# Patient Record
Sex: Male | Born: 1980 | Race: White | Hispanic: No | Marital: Single | Smoking: Current every day smoker
Health system: Southern US, Community
[De-identification: ages and names within clinical notes are randomized; demographics above are authoritative.]

---

## 2018-12-17 ENCOUNTER — Other Ambulatory Visit: Payer: Self-pay

## 2018-12-17 ENCOUNTER — Emergency Department
Admission: EM | Admit: 2018-12-17 | Discharge: 2018-12-17 | Disposition: A | Discharge status: Home / Self Care | Payer: Self-pay | Attending: Emergency Medicine | Admitting: Emergency Medicine

## 2018-12-17 DIAGNOSIS — L0291 Cutaneous abscess, unspecified: Secondary | ICD-10-CM

## 2018-12-17 DIAGNOSIS — L0201 Cutaneous abscess of face: Secondary | ICD-10-CM | POA: Insufficient documentation

## 2018-12-17 MED ORDER — MUPIROCIN 2 % EX OINT: TOPICAL_OINTMENT | CUTANEOUS | 0 refills | Status: AC

## 2018-12-17 MED ORDER — LIDOCAINE-EPINEPHRINE 1 %-1:100000 IJ SOLN
10.0000 mL | Freq: Once | INTRAMUSCULAR | Status: AC
  Administered 2018-12-17: 10 mL via INTRADERMAL
  Filled 2018-12-17: qty 10

## 2018-12-17 NOTE — ED Notes (Signed)
Abscess to chin, pt states clear liquid has seeped from abscess. Painful to eat or touch.

## 2018-12-17 NOTE — ED Triage Notes (Signed)
Pt reports walked through a spider web yesterday. Last night woke up and noticed a red area on his chin that has gotten worse.

## 2018-12-17 NOTE — ED Provider Notes (Signed)
[facility] Emergency Department Provider Note       Time seen: ----------------------------------------- 9:35 PM on 12/17/2018 -----------------------------------------   I have reviewed the triage vital signs and the nursing notes.  HISTORY   Chief Complaint Abscess    HPI Bryan Coffey is a 38 y.o. male with no significant past medical history who presents to the ED for small spider bite.  Patient reports he walked through a spiderweb yesterday and is concerned he may have been bitten by spider.  He has had an abscess before.  He noticed a red area on his chin that is gotten worse.  No past medical history on file.  There are no active problems to display for this patient.  Allergies Patient has no known allergies.  Social History Social History   Tobacco Use  . Smoking status: Not on file  Substance Use Topics  . Alcohol use: Not on file  . Drug use: Not on file   Review of Systems Constitutional: Negative for fever. Cardiovascular: Negative for chest pain. Respiratory: Negative for shortness of breath. Skin: Positive for red raised area on his chin Neurological: Negative for headaches, focal weakness or numbness.  All systems negative/normal/unremarkable except as stated in the HPI  ____________________________________________   PHYSICAL EXAM:  VITAL SIGNS: ED Triage Vitals  Enc Vitals Group     BP 12/17/18 2125 (!) 141/75     Pulse Rate 12/17/18 2125 93     Resp 12/17/18 2125 18     Temp 12/17/18 2125 98.4 F (36.9 C)     Temp Source 12/17/18 2125 Oral     SpO2 12/17/18 2125 97 %     Weight 12/17/18 2127 164 lb (74.4 kg)     Height 12/17/18 2127 6\' 2"  (1.88 m)     Head Circumference --      Peak Flow --      Pain Score 12/17/18 2126 7     Pain Loc --      Pain Edu? --      Excl. in GC? --    Constitutional: Alert and oriented. Well appearing and in no distress. ENT      Head: Normocephalic and atraumatic.  Nose: No congestion/rhinnorhea.      Mouth/Throat: Mucous membranes are moist.  Externally inferior to the lower lip on the left side there is an abscess developing that is approximately 2 x 2 cm with induration and erythema      Neck: No stridor. Neurologic:  Normal speech and language. No gross focal neurologic deficits are appreciated.  Skin: Left-sided facial abscess as dictated above ___________________________________________  ED COURSE:  As part of my medical decision making, I reviewed the following data within the electronic MEDICAL RECORD NUMBER History obtained from family if available, nursing notes, old chart and ekg, as well as notes from prior ED visits. Patient presented for skin abscess, we will assess with labs and imaging as indicated at this time.   Marland Kitchen.Incision and Drainage Date/Time: 12/17/2018 9:39 PM Performed by: Emily Filbert, MD Authorized by: Emily Filbert, MD   Consent:    Consent obtained:  Verbal   Consent given by:  Patient Location:    Type:  Abscess   Location:  Head   Head location:  Face Pre-procedure details:    Skin preparation:  Betadine Anesthesia (see MAR for exact dosages):    Anesthesia method:  Local infiltration   Local anesthetic:  Lidocaine 1% WITH epi Procedure type:  Complexity:  Simple Procedure details:    Incision types:  Single straight   Scalpel blade:  11   Drainage:  Purulent   Drainage amount:  Moderate   Wound treatment:  Drain placed   Packing materials:  1/4 in gauze Post-procedure details:    Patient tolerance of procedure:  Tolerated well, no immediate complications    ____________________________________________   DIFFERENTIAL DIAGNOSIS   Abscess, cellulitis, insect bite  FINAL ASSESSMENT AND PLAN  Abscess   Plan: The patient had presented for early abscess on his chin.  This was incised as dictated above.  We will advise topical antibiotic ointment and follow-up as needed.   Bryan Coffey  Bryan Giroux, MD    Note: This note was generated in part or whole with voice recognition software. Voice recognition is usually quite accurate but there are transcription errors that can and very often do occur. I apologize for any typographical errors that were not detected and corrected.     Emily FilbertWilliams, Bryan Kelson E, MD 12/17/18 2140

## 2018-12-21 ENCOUNTER — Encounter: Payer: Self-pay | Admitting: *Deleted

## 2018-12-21 ENCOUNTER — Other Ambulatory Visit: Payer: Self-pay

## 2018-12-21 ENCOUNTER — Emergency Department: Payer: Self-pay

## 2018-12-21 ENCOUNTER — Emergency Department
Admission: EM | Admit: 2018-12-21 | Discharge: 2018-12-22 | Disposition: A | Discharge status: Home / Self Care | Payer: Self-pay | Attending: Emergency Medicine | Admitting: Emergency Medicine

## 2018-12-21 DIAGNOSIS — L03211 Cellulitis of face: Secondary | ICD-10-CM | POA: Insufficient documentation

## 2018-12-21 DIAGNOSIS — Z79899 Other long term (current) drug therapy: Secondary | ICD-10-CM | POA: Insufficient documentation

## 2018-12-21 DIAGNOSIS — F17210 Nicotine dependence, cigarettes, uncomplicated: Secondary | ICD-10-CM | POA: Insufficient documentation

## 2018-12-21 LAB — COMPREHENSIVE METABOLIC PANEL
ALT: 73 U/L — ABNORMAL HIGH (ref 0–44)
AST: 55 U/L — ABNORMAL HIGH (ref 15–41)
Albumin: 3.8 g/dL (ref 3.5–5.0)
Alkaline Phosphatase: 94 U/L (ref 38–126)
Anion gap: 8 (ref 5–15)
BUN: 11 mg/dL (ref 6–20)
CO2: 27 mmol/L (ref 22–32)
Calcium: 8.6 mg/dL — ABNORMAL LOW (ref 8.9–10.3)
Chloride: 102 mmol/L (ref 98–111)
Creatinine, Ser: 0.73 mg/dL (ref 0.61–1.24)
GFR calc Af Amer: >60 mL/min (ref >60)
GFR calc non Af Amer: >60 mL/min (ref >60)
Glucose, Bld: 109 mg/dL — ABNORMAL HIGH (ref 70–99)
Potassium: 4.7 mmol/L (ref 3.5–5.1)
Sodium: 137 mmol/L (ref 135–145)
Total Bilirubin: 0.7 mg/dL (ref 0.3–1.2)
Total Protein: 7.6 g/dL (ref 6.5–8.1)

## 2018-12-21 LAB — CBC WITH DIFFERENTIAL/PLATELET
Abs Immature Granulocytes: 0.02 10*3/uL (ref 0.00–0.07)
Basophils Absolute: 0 10*3/uL (ref 0.0–0.1)
Basophils Relative: 0 %
Eosinophils Absolute: 0.1 10*3/uL (ref 0.0–0.5)
Eosinophils Relative: 1 %
HCT: 36.7 % — ABNORMAL LOW (ref 39.0–52.0)
Hemoglobin: 11.8 g/dL — ABNORMAL LOW (ref 13.0–17.0)
Immature Granulocytes: 0 %
Lymphocytes Relative: 25 %
Lymphs Abs: 2.3 10*3/uL (ref 0.7–4.0)
MCH: 27.1 pg (ref 26.0–34.0)
MCHC: 32.2 g/dL (ref 30.0–36.0)
MCV: 84.4 fL (ref 80.0–100.0)
Monocytes Absolute: 0.8 10*3/uL (ref 0.1–1.0)
Monocytes Relative: 8 %
Neutro Abs: 6.3 10*3/uL (ref 1.7–7.7)
Neutrophils Relative %: 66 %
Platelets: 333 10*3/uL (ref 150–400)
RBC: 4.35 MIL/uL (ref 4.22–5.81)
RDW: 14 % (ref 11.5–15.5)
WBC: 9.5 10*3/uL (ref 4.0–10.5)
nRBC: 0 % (ref 0.0–0.2)

## 2018-12-21 LAB — LACTIC ACID, PLASMA: Lactic Acid, Venous: 1.3 mmol/L (ref 0.5–1.9)

## 2018-12-21 MED ORDER — CLINDAMYCIN PHOSPHATE 600 MG/50ML IV SOLN
600.0000 mg | Freq: Once | INTRAVENOUS | Status: AC
  Administered 2018-12-21: 600 mg via INTRAVENOUS
  Filled 2018-12-21: qty 50

## 2018-12-21 MED ORDER — SODIUM CHLORIDE 0.9 % IV BOLUS
1000.0000 mL | Freq: Once | INTRAVENOUS | Status: AC
  Administered 2018-12-21: 23:00:00 1000 mL via INTRAVENOUS

## 2018-12-21 MED ORDER — MORPHINE SULFATE (PF) 4 MG/ML IV SOLN
4.0000 mg | Freq: Once | INTRAVENOUS | Status: AC
  Administered 2018-12-21: 4 mg via INTRAVENOUS
  Filled 2018-12-21: qty 1

## 2018-12-21 MED ORDER — ONDANSETRON HCL 4 MG/2ML IJ SOLN
4.0000 mg | Freq: Once | INTRAMUSCULAR | Status: AC
  Administered 2018-12-21: 4 mg via INTRAVENOUS
  Filled 2018-12-21: qty 2

## 2018-12-21 MED ORDER — IOHEXOL 300 MG/ML  SOLN
75.0000 mL | Freq: Once | INTRAMUSCULAR | Status: AC | PRN
  Administered 2018-12-21: 23:00:00 75 mL via INTRAVENOUS
  Filled 2018-12-21: qty 75

## 2018-12-21 NOTE — ED Provider Notes (Signed)
[facility] Emergency Department Provider Note  ____________________________________________  Time seen: Approximately 10:42 PM  I have reviewed the triage vital signs and the nursing notes.   HISTORY  Chief Complaint Abscess    HPI Bryan Coffey is a 38 y.o. male who presents emergency department complaining of increased erythema, edema, pain to the external left lower jaw.  Patient was seen in this department several days prior, had abscess formation.  This area was incised and drained and patient was placed on a topical antibiotic.  Patient reports that the area has significantly worsened, area is still open and draining but has increased erythema, edema extending under his jaw with increased pain.  No fevers or chills.  No difficulty breathing or swallowing.  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  No other complaints at this time.  No other skin lesions.         History reviewed. No pertinent past medical history.  There are no active problems to display for this patient.   History reviewed. No pertinent surgical history.  Prior to Admission medications   Medication Sig Start Date End Date Taking? Authorizing Provider  clindamycin (CLEOCIN) 300 MG capsule Take 1 capsule (300 mg total) by mouth 4 (four) times daily. 12/22/18   Kennedie Pardoe, Delorise Royals, PA-C  HYDROcodone-acetaminophen (NORCO/VICODIN) 5-325 MG tablet Take 1 tablet by mouth every 4 (four) hours as needed for moderate pain. 12/22/18   Boone Gear, Delorise Royals, PA-C  mupirocin ointment (BACTROBAN) 2 % Apply to affected area 3 times daily 12/17/18   Emily Filbert, MD    Allergies Patient has no known allergies.  No family history on file.  Social History Social History   Tobacco Use  . Smoking status: Current Every Day Smoker  . Smokeless tobacco: Never Used  Substance Use Topics  . Alcohol use: Not Currently  . Drug use: Not Currently     Review of  Systems  Constitutional: No fever/chills Eyes: No visual changes.  Cardiovascular: no chest pain. Respiratory: no cough. No SOB. Gastrointestinal: No abdominal pain.  No nausea, no vomiting.  Musculoskeletal: Negative for musculoskeletal pain. Skin: Positive for an erythematous edematous lesion to the external left lower jaw. Neurological: Negative for headaches, focal weakness or numbness. 10-point ROS otherwise negative.  ____________________________________________   PHYSICAL EXAM:  VITAL SIGNS: ED Triage Vitals  Enc Vitals Group     BP 12/21/18 2027 124/79     Pulse Rate 12/21/18 2027 90     Resp 12/21/18 2027 18     Temp 12/21/18 2027 98.2 F (36.8 C)     Temp Source 12/21/18 2027 Oral     SpO2 12/21/18 2027 98 %     Weight 12/21/18 2021 164 lb (74.4 kg)     Height 12/21/18 2021 6\' 2"  (1.88 m)     Head Circumference --      Peak Flow --      Pain Score 12/21/18 2021 8     Pain Loc --      Pain Edu? --      Excl. in GC? --      Constitutional: Alert and oriented. Well appearing and in no acute distress. Eyes: Conjunctivae are normal. PERRL. EOMI. Head: Atraumatic.  Patient has a large erythematous edematous lesion noted to the external left lower mandibular region.  This measures approximately 6 cm in diameter.  Area is draining purulent material.  Additional material is expressed with palpation.  No significant  fluctuance.  Area is very tender to palpation.  No significant regional lymphadenopathy.  No intraoral extension. ENT:      Ears:       Nose: No congestion/rhinnorhea.      Mouth/Throat: Mucous membranes are moist.  Neck: No stridor.  Hematological/Lymphatic/Immunilogical: No cervical lymphadenopathy.  Cardiovascular: Normal rate, regular rhythm. Normal S1 and S2.  Good peripheral circulation. Respiratory: Normal respiratory effort without tachypnea or retractions. Lungs CTAB. Good air entry to the bases with no decreased or absent breath  sounds. Musculoskeletal: Full range of motion to all extremities. No gross deformities appreciated. Neurologic:  Normal speech and language. No gross focal neurologic deficits are appreciated.  Skin:  Skin is warm, dry and intact. No rash noted. Psychiatric: Mood and affect are normal. Speech and behavior are normal. Patient exhibits appropriate insight and judgement.   ____________________________________________   LABS (all labs ordered are listed, but only abnormal results are displayed)  Labs Reviewed  COMPREHENSIVE METABOLIC PANEL - Abnormal; Notable for the following components:      Result Value   Glucose, Bld 109 (*)    Calcium 8.6 (*)    AST 55 (*)    ALT 73 (*)    All other components within normal limits  CBC WITH DIFFERENTIAL/PLATELET - Abnormal; Notable for the following components:   Hemoglobin 11.8 (*)    HCT 36.7 (*)    All other components within normal limits  LACTIC ACID, PLASMA   ____________________________________________  EKG   ____________________________________________  RADIOLOGY I personally viewed and evaluated these images as part of my medical decision making, as well as reviewing the written report by the radiologist.  Ct Maxillofacial W Contrast  Result Date: 12/22/2018 CLINICAL DATA:  Initial evaluation for abscess to chin, erythema with drainage. EXAM: CT MAXILLOFACIAL WITH CONTRAST TECHNIQUE: Multidetector CT imaging of the maxillofacial structures was performed with intravenous contrast. Multiplanar CT image reconstructions were also generated. CONTRAST:  75mL OMNIPAQUE IOHEXOL 300 MG/ML  SOLN COMPARISON:  None. FINDINGS: Osseous: Examination technically limited by motion artifact. No acute osseous abnormality about the face. Periapical lucency noted about the left maxillary lateral incisor (series 3, image 49). Orbits: Globes and orbital soft tissues within normal limits. Sinuses: Paranasal sinuses are clear. Mastoid air cells and middle ear  cavities are well pneumatized and free of fluid. Soft tissues: Focal soft tissue swelling seen at the central and left aspect of the chin, just to the left of midline (series 2, image 68), suggesting cellulitis given provided history. Mild hazy stranding extends into the submental region. Overlying focal skin protuberance within this region could reflect a focal draining sinus tract (series 2, image 68). Probable underlying ill-defined phlegmon without definite discrete abscess or drainable fluid collection. No other collections elsewhere about the chin. Mildly prominent submental lymph nodes noted measuring up to 7 mm, likely reactive. No other acute soft tissue abnormality identified about the visualized face and neck. Oral cavity and oropharynx limited assessment due to motion artifact and extensive streak artifact from dental amalgam. No retropharyngeal collection. Limited intracranial: Unremarkable. IMPRESSION: 1. Focal soft tissue swelling at the central and left aspect of the chin, just to the left of midline, consistent with cellulitis given provided history. Overlying focal skin protuberance within this region could reflect a focal draining sinus tract. No definite discrete abscess or drainable fluid collection identified on this motion degraded exam. 2. Periapical lucency about the left maxillary lateral incisor, suggesting periapical abscess. No associated inflammatory changes. She Electronically Signed  By: Rise Muock M.D.   On: 12/22/2018 00:50    ____________________________________________    PROCEDURES  Procedure(s) performed:    Procedures    Medications  sodium chloride 0.9 % bolus 1,000 mL (1,000 mLs Intravenous New Bag/Given 12/21/18 2232)  clindamycin (CLEOCIN) IVPB 600 mg (0 mg Intravenous Stopped 12/21/18 2346)  morphine 4 MG/ML injection 4 mg (4 mg Intravenous Given 12/21/18 2347)  ondansetron (ZOFRAN) injection 4 mg (4 mg Intravenous Given 12/21/18 2347)  iohexol  (OMNIPAQUE) 300 MG/ML solution 75 mL (75 mLs Intravenous Contrast Given 12/21/18 2310)     ____________________________________________   INITIAL IMPRESSION / ASSESSMENT AND PLAN / ED COURSE  Pertinent labs & imaging results that were available during my care of the patient were reviewed by me and considered in my medical decision making (see chart for details).  Review of the [place] CSRS was performed in accordance of the [facility] prior to dispensing any controlled drugs.           Patient's diagnosis is consistent with facial cellulitis.  Patient presented to the emergency department with a complaint of worsening cellulitis to the left chin.  Patient emergency to this department, had small abscess opened and incised.  Patient was given topical antibiotics and area worsen.  Obvious cellulitis to the chin.  Patient had labs and imaging to evaluate for deeper underlying abscess which returns with reassuring results.  Patient was given IV clindamycin and will be discharged with oral clindamycin.  Limited prescription for pain medication as well.  Follow-up with primary care as needed. Patient is given ED precautions to return to the ED for any worsening or new symptoms.     ____________________________________________  FINAL CLINICAL IMPRESSION(S) / ED DIAGNOSES  Final diagnoses:  Facial cellulitis      NEW MEDICATIONS STARTED DURING THIS VISIT:  ED Discharge Orders         Ordered    clindamycin (CLEOCIN) 300 MG capsule  4 times daily     12/22/18 0057    HYDROcodone-acetaminophen (NORCO/VICODIN) 5-325 MG tablet  Every 4 hours PRN     12/22/18 0057              This chart was dictated using voice recognition software/Dragon. Despite best efforts to proofread, errors can occur which can change the meaning. Any change was purely unintentional.    Racheal Patches, PA-C 12/22/18 0102    Phineas Semen, MD 12/22/18 [phone]

## 2018-12-21 NOTE — ED Notes (Signed)
Patient states he was here a couple days ago and was given an antibotic cream but no oral antibiotic and abscess has gotten worse. Patient in NAD.

## 2018-12-21 NOTE — ED Triage Notes (Signed)
Pt has an abscess to the chin.  Pt was seen here in the ER for similar sx recently.  Area red and draining.  Pt alert.

## 2018-12-22 ENCOUNTER — Encounter: Payer: Self-pay | Admitting: Emergency Medicine

## 2018-12-22 ENCOUNTER — Emergency Department: Payer: Self-pay

## 2018-12-22 MED ORDER — HYDROCODONE-ACETAMINOPHEN 5-325 MG PO TABS: 1.0000 | ORAL_TABLET | ORAL | 0 refills | Status: AC | PRN

## 2018-12-22 MED ORDER — CLINDAMYCIN HCL 300 MG PO CAPS: 300.0000 mg | ORAL_CAPSULE | Freq: Four times a day (QID) | ORAL | 0 refills | Status: DC

## 2020-09-04 ENCOUNTER — Encounter: Payer: Self-pay | Admitting: Emergency Medicine

## 2020-09-04 ENCOUNTER — Emergency Department
Admission: EM | Admit: 2020-09-04 | Discharge: 2020-09-04 | Disposition: A | Discharge status: Home / Self Care | Payer: Self-pay | Attending: Emergency Medicine | Admitting: Emergency Medicine

## 2020-09-04 ENCOUNTER — Other Ambulatory Visit: Payer: Self-pay

## 2020-09-04 DIAGNOSIS — F17200 Nicotine dependence, unspecified, uncomplicated: Secondary | ICD-10-CM | POA: Insufficient documentation

## 2020-09-04 DIAGNOSIS — K047 Periapical abscess without sinus: Secondary | ICD-10-CM | POA: Insufficient documentation

## 2020-09-04 MED ORDER — CLINDAMYCIN HCL 150 MG PO CAPS
300.0000 mg | ORAL_CAPSULE | Freq: Once | ORAL | Status: AC
  Administered 2020-09-04: 300 mg via ORAL
  Filled 2020-09-04: qty 2

## 2020-09-04 MED ORDER — CLINDAMYCIN HCL 300 MG PO CAPS: 300.0000 mg | ORAL_CAPSULE | Freq: Three times a day (TID) | ORAL | 0 refills | Status: AC

## 2020-09-04 MED ORDER — LIDOCAINE VISCOUS HCL 2 % MT SOLN
15.0000 mL | Freq: Once | OROMUCOSAL | Status: AC
  Administered 2020-09-04: 15 mL via OROMUCOSAL
  Filled 2020-09-04: qty 15

## 2020-09-04 MED ORDER — LIDOCAINE VISCOUS HCL 2 % MT SOLN: 15.0000 mL | OROMUCOSAL | 0 refills | Status: AC | PRN

## 2020-09-04 NOTE — ED Triage Notes (Signed)
Pt to ED via POV with c/o R upper jaw dental pain that started several days ago with swelling that started yesterday. Pt states pain 7/10 with throbbing pain noted.

## 2020-09-04 NOTE — ED Notes (Signed)
Pt agreeable with d/c plan as discussed by provider- this nurse has verbally reinforced d/c instructions and provided pt with written copy - pt acknowledges verbal understanding of d/ plan and denies any additional needs, questions, concerns

## 2020-09-04 NOTE — Discharge Instructions (Signed)
OPTIONS FOR DENTAL FOLLOW UP CARE ° °[place] [facility] and [facility] - Local Safety Net Dental Clinics °http://www.ncdhhs.gov/dph/oralhealth/services/safetynetclinics.htm °  °[facility] (336-562-3123) ° °[facility] ([phone]) ° °[facility] ([phone] ext [phone]) ° °[facility] ([phone]) ° °[facility] ([phone]) °This clinic caters to the indigent population and is on a lottery system. °Location: °[facility], [address], [place] °Clinic Hours: °Wednesdays from 6pm - 9pm, patients seen by a lottery system. °For dates, call or go to [email or web address]/[facility]/[email or web address] °Services: °Cleanings, fillings and simple extractions. °Payment Options: °DENTAL WORK IS FREE OF CHARGE. Bring proof of income or support. °Best way to get seen: °Arrive at 5:15 pm - this is a lottery, NOT first come/first serve, so arriving earlier will not increase your chances of being seen. °  °  °[facility] °[phone] °Select option 1 for emergencies °  °Location: °[facility], [address], [place] °Clinic Hours: °No walk-ins accepted - call the day before to schedule an appointment. °Check in times are 9:30 am and 1:30 pm. °Services: °Simple extractions, temporary fillings, pulpectomy/pulp debridement, uncomplicated abscess drainage. °Payment Options: °PAYMENT IS DUE AT THE TIME OF SERVICE.  Fee is usually $100-200, additional surgical procedures (e.g. abscess drainage) may be extra. °Cash, checks, Visa/MasterCard accepted.  Can file Medicaid if patient is covered for dental - patient should call case worker to check. °No discount for [facility] patients. °Best way to get seen: °MUST call the day before and get onto the schedule. Can usually be seen the next 1-2 days. No walk-ins accepted. °  °  °[facility] °[phone] °   °Location: °[facility], [address], [place] °Clinic Hours: °M, W, Th, F 8am or 1:30pm, Tues 9a or 1:30 - first come/first served. °Services: °Simple extractions, temporary fillings, uncomplicated abscess drainage.  You do not need to be an [place] resident. °Payment Options: °PAYMENT IS DUE AT THE TIME OF SERVICE. °Dental insurance, otherwise sliding scale - bring proof of income or support. °Depending on income and treatment needed, cost is usually $50-200. °Best way to get seen: °Arrive early as it is first come/first served. °  °  °[facility] °[phone] °  °Location: °[address] °Clinic Hours: °Mon-Thu 8a-5p °Services: °Most basic dental services including extractions and fillings. °Payment Options: °PAYMENT IS DUE AT THE TIME OF SERVICE. °Sliding scale, up to 50% off - bring proof if income or support. °Medicaid with dental option accepted. °Best way to get seen: °Call to schedule an appointment, can usually be seen within 2 weeks OR they will try to see walk-ins - show up at 8a or 2p (you may have to wait). °  °  °[facility] °[phone] °[place] RESIDENTS ONLY °  °Location: °[facility], [address], [place], [place] [address] °Clinic Hours: By appointment only. °Monday - Thursday 8am-5pm, Friday 8am-12pm °Services: Cleanings, fillings, extractions. °Payment Options: °PAYMENT IS DUE AT THE TIME OF SERVICE. °Cash, Visa or MasterCard. Sliding scale - $30 minimum per service. °Best way to get seen: °Come in to office, complete packet and make an appointment - need proof of income °or support monies for each household member and proof of [place] residence. °Usually takes about a month to get in. °  °  °[facility] °[phone] °  °Location: °[address]   [place] °Clinic Hours: Walk-in Urgent Care Dental Services are offered Monday-Friday  mornings only. °The numbers of emergencies accepted daily is limited to the number of °providers available. °Maximum 15 - Mondays, Wednesdays & Thursdays °Maximum 10 - Tuesdays & Fridays °Services: °You do not need to be a [place] resident to be seen for a dental emergency. °Emergencies are defined as pain, swelling, abnormal bleeding, or dental trauma. Walkins will receive x-rays if needed. °NOTE: Dental cleaning is not an emergency. °Payment Options: °PAYMENT IS DUE AT THE TIME OF SERVICE. °Minimum co-pay is $40.00 for uninsured patients. °Minimum co-pay is $3.00 for Medicaid with dental coverage. °Dental Insurance is accepted and must be presented at time of visit. °Medicare does not cover dental. °Forms of payment: Cash, credit card, checks. °Best way to get seen: °If not previously registered with the clinic, walk-in dental registration begins at 7:15 am and is on a first come/first serve basis. °If previously registered with the clinic, call to make an appointment. °  °  °[facility] °[phone] °[place] RESIDENTS ONLY °  °Location: °[address], [place], [place] °Clinic Hours: °Mon-Thu 10a-2p °Services: Extractions only! °Payment Options: °FREE (donations accepted) - bring proof of income or support °Best way to get seen: °Call and schedule an appointment OR come at 8am on the 1st Monday of every month (except for holidays) when it is first come/first served. °  °  °[facility] °[phone] °  °Location: °[address], [place] °Clinic Hours: °Friday mornings °Services, Payment Options, Best way to get seen: °Call for info °

## 2020-09-04 NOTE — ED Provider Notes (Signed)
[facility] Emergency Department Provider Note  ____________________________________________   Event Date/Time   First MD Initiated Contact with Patient 09/04/20 1928     (approximate)  I have reviewed the triage vital signs and the nursing notes.   HISTORY  Chief Complaint Dental Pain   HPI Bryan Coffey is a 40 y.o. male who presents to the emergency department for evaluation of right upper jaw pain.  Patient states he has had pain and throbbing in this area that started several days ago.  He reports the pain to be a 7/10.  He has not tried any alleviating factors.  Patient states that he has had multiple bouts of dental work not back when he was in [employer], none recent.  He has made a dentist appointment however could not get in until 1/27.  He did patient earlier today and came for antibiotics.  He denies any fevers, pain under the tongue, pain in the neck.       History reviewed. No pertinent past medical history.  There are no problems to display for this patient.   History reviewed. No pertinent surgical history.  Prior to Admission medications   Medication Sig Start Date End Date Taking? Authorizing Provider  clindamycin (CLEOCIN) 300 MG capsule Take 1 capsule (300 mg total) by mouth 3 (three) times daily for 10 days. 09/04/20 09/14/20 Yes Fitz Matsuo, Ruben Gottron, PA  lidocaine (XYLOCAINE) 2 % solution Use as directed 15 mLs in the mouth or throat as needed for mouth pain. 09/04/20  Yes Lucy Chris, PA  HYDROcodone-acetaminophen (NORCO/VICODIN) 5-325 MG tablet Take 1 tablet by mouth every 4 (four) hours as needed for moderate pain. 12/22/18   Cuthriell, Delorise Royals, PA-C  mupirocin ointment (BACTROBAN) 2 % Apply to affected area 3 times daily 12/17/18   Emily Filbert, MD    Allergies Patient has no known allergies.  History reviewed. No pertinent family history.  Social History Social History   Tobacco Use  . Smoking status:  Current Every Day Smoker  . Smokeless tobacco: Never Used  Substance Use Topics  . Alcohol use: Not Currently  . Drug use: Not Currently    Review of Systems Constitutional: No fever/chills Eyes: No visual changes. ENT: + Dental pain, no sore throat. Cardiovascular: Denies chest pain. Respiratory: Denies shortness of breath. Gastrointestinal: No abdominal pain.  No nausea, no vomiting.  No diarrhea.  No constipation. Genitourinary: Negative for dysuria. Musculoskeletal: Negative for back pain. Skin: Negative for rash. Neurological: Negative for headaches, focal weakness or numbness.  ____________________________________________   PHYSICAL EXAM:  VITAL SIGNS: ED Triage Vitals [09/04/20 1834]  Enc Vitals Group     BP 111/70     Pulse Rate 93     Resp 20     Temp 98.5 F (36.9 C)     Temp Source Oral     SpO2 98 %     Weight 194 lb (88 kg)     Height 6\' 2"  (1.88 m)     Head Circumference      Peak Flow      Pain Score 7     Pain Loc      Pain Edu?      Excl. in GC?    Constitutional: Alert and oriented. Well appearing and in no acute distress. Eyes: Conjunctivae are normal. PERRL. EOMI. Head: Atraumatic. Nose: No congestion/rhinnorhea. Mouth/Throat: There is a moderate amount of right-sided facial swelling at the location of the upper jaw.  No facial erythema.  No tenderness to palpation of the mandible.  There are multiple dental caries and broken teeth within the mouth.  No specific abscess able to be identified.  There is no tenderness under the tongue Neck: No stridor.  There is no tenderness to the central anterior portion of the neck, mild cervical lymphadenopathy, no swelling or erythema of the neck Cardiovascular: Normal rate, regular rhythm. Grossly normal heart sounds.  Good peripheral circulation. Respiratory: Normal respiratory effort.  No retractions. Lungs CTAB. Skin:  Skin is warm, dry and intact. No rash noted. Psychiatric: Mood and affect are normal.  Speech and behavior are normal.  _____________________________________________   INITIAL IMPRESSION / ASSESSMENT AND PLAN / ED COURSE  As part of my medical decision making, I reviewed the following data within the electronic MEDICAL RECORD NUMBER Nursing notes reviewed and incorporated        Patient is a 40 year old male who presents to the emergency department for evaluation of right upper sided jaw pain has been present for 3 days that began having some facial swelling associated today.  He denies fever, denies pain over the tongue, denies anterior neck pain.  See HPI for further details.  In triage, the patient has normal vital signs.  On physical exam, there is a moderate amount of facial swelling, most prominent in the maxillary region on the right-hand side.  There is no swelling over the mandible or tenderness there.  There is no tenderness of the anterior neck, no swelling or erythema of the anterior neck.  Mild cervical lymphadenopathy.  Differentials considered for the patient include dental carry, dental abscess, deep abscess, Ludwig's, Lemierre's, other infectious pathology.  The patient has been afebrile during the course of his pain.  The patient specifically declines labs or imaging or IV antibiotics even despite the facial swelling.  We will place the patient on clindamycin.  Strict return precautions were given for fever, worsening pain despite antibiotics, difficulty breathing, anterior neck pain, pain under the tongue.  Patient acknowledges these.  He also specifically requests no pill form of pain management including no ibuprofen given that he had former problems with opioids.  He was offered viscous lidocaine prescription which he accepted.  Patient will return with any acute worsening.      ____________________________________________   FINAL CLINICAL IMPRESSION(S) / ED DIAGNOSES  Final diagnoses:  Dental abscess     ED Discharge Orders         Ordered    clindamycin  (CLEOCIN) 300 MG capsule  3 times daily        09/04/20 1944    lidocaine (XYLOCAINE) 2 % solution  As needed        09/04/20 1944          *Please note:  Deondre Marinaro was evaluated in Emergency Department on 09/04/2020 for the symptoms described in the history of present illness. He was evaluated in the context of the global COVID-19 pandemic, which necessitated consideration that the patient might be at risk for infection with the SARS-CoV-2 virus that causes COVID-19. Institutional protocols and algorithms that pertain to the evaluation of patients at risk for COVID-19 are in a state of rapid change based on information released by regulatory bodies including the CDC and federal and state organizations. These policies and algorithms were followed during the patient's care in the ED.  Some ED evaluations and interventions may be delayed as a result of limited staffing during and the pandemic.*   Note:  This document was prepared using Dragon voice recognition software and may include unintentional dictation errors.   Lucy Chris, PA 09/04/20 2052    Delton [place], MD 09/04/20 [phone]

## 2020-10-21 IMAGING — CT CT MAXILLOFACIAL WITH CONTRAST
3 series · 15 of 47 positions shown, 18 images · IV contrast (omnipaque)
Comparison: None.

CLINICAL DATA: Initial evaluation for abscess to chin, erythema
with drainage.

EXAM:
CT MAXILLOFACIAL WITH CONTRAST
TECHNIQUE: Multidetector CT imaging of the maxillofacial structures was
performed with intravenous contrast. Multiplanar CT image
reconstructions were also generated.
CONTRAST:  75mL OMNIPAQUE IOHEXOL 300 MG/ML  SOLN

[Series 2: max soft · axial · 0.37mm/px · z∈[-222,-68]mm · 9 of 91 slices shown, 12 images]
[im 7/91  brain]
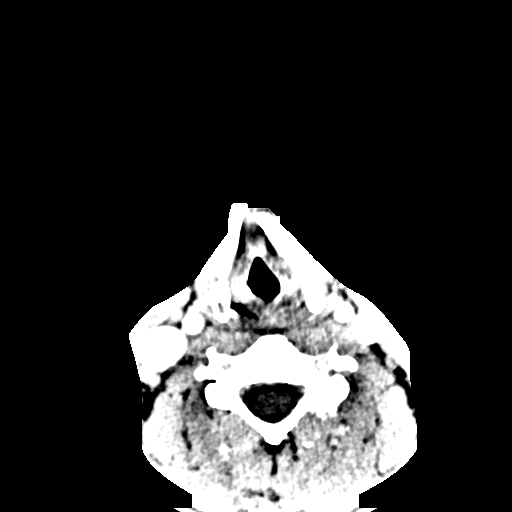
[im 7/91  bone]
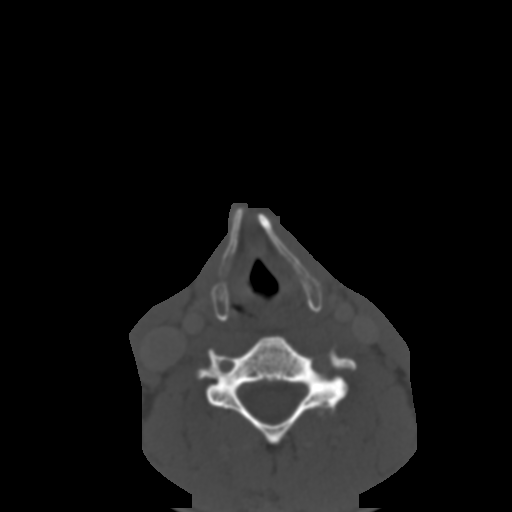
[im 16/91  bone]
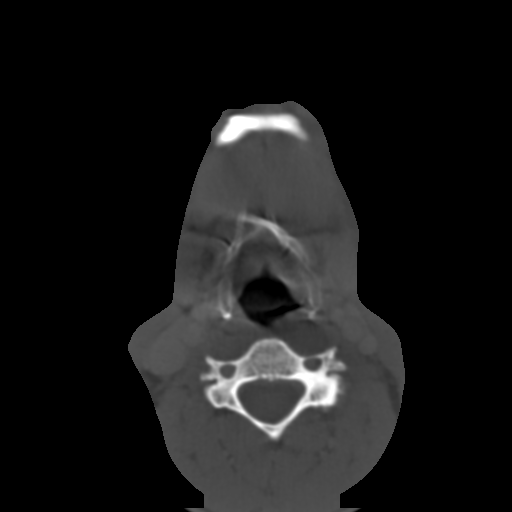
[im 25/91  bone]
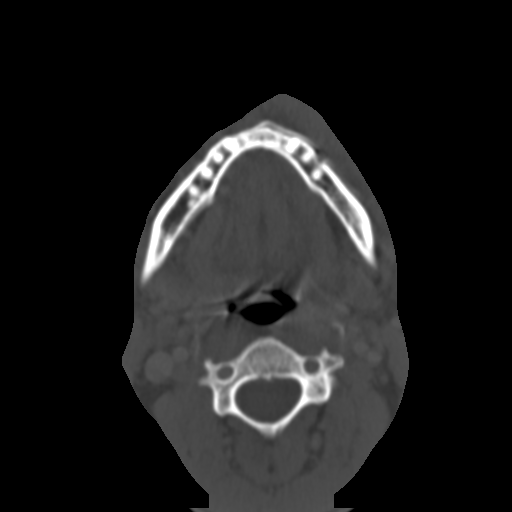
[im 35/91  bone]
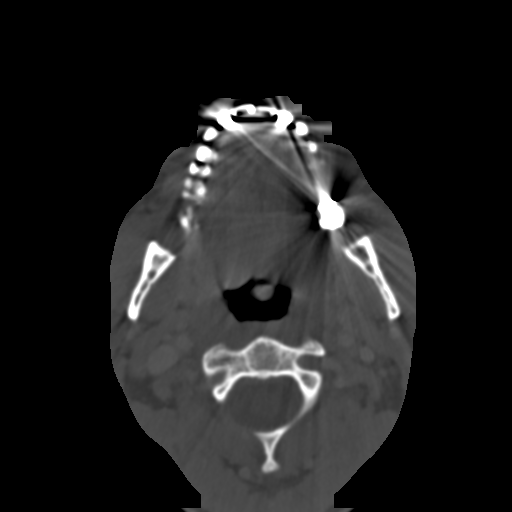
[im 47/91  brain]
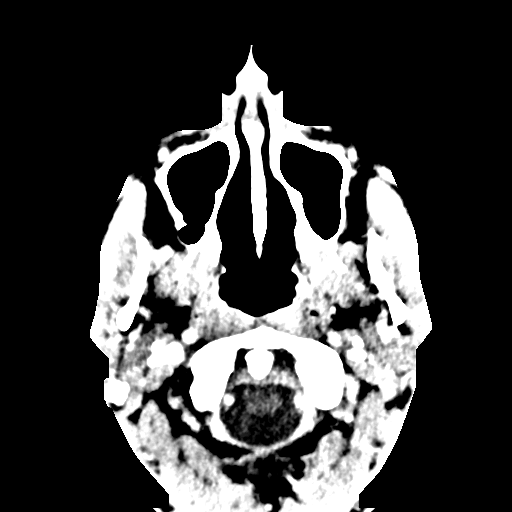
[im 47/91  bone]
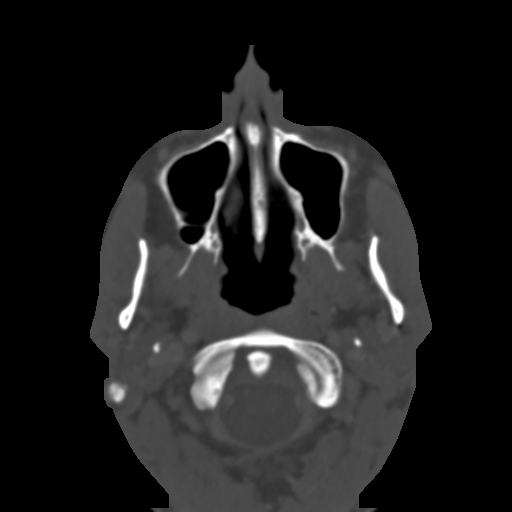
[im 56/91  bone]
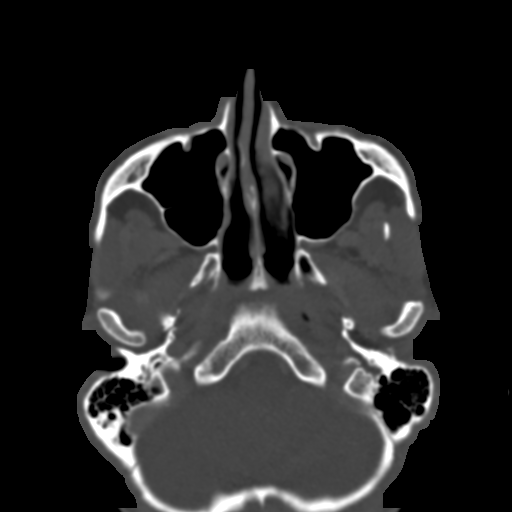
[im 66/91  bone]
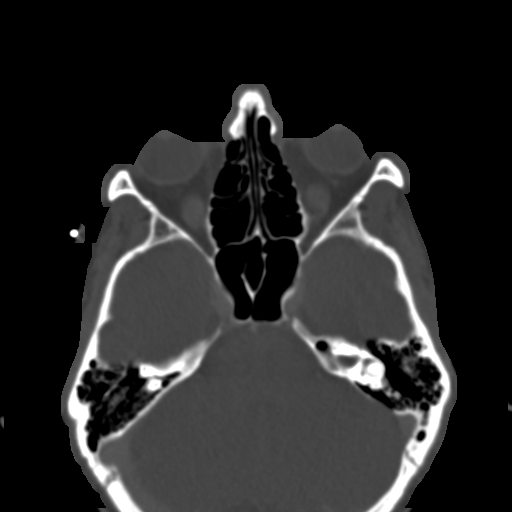
[im 75/91  bone]
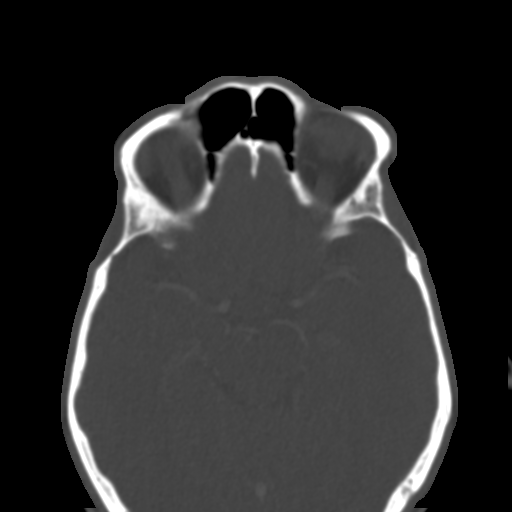
[im 84/91  brain]
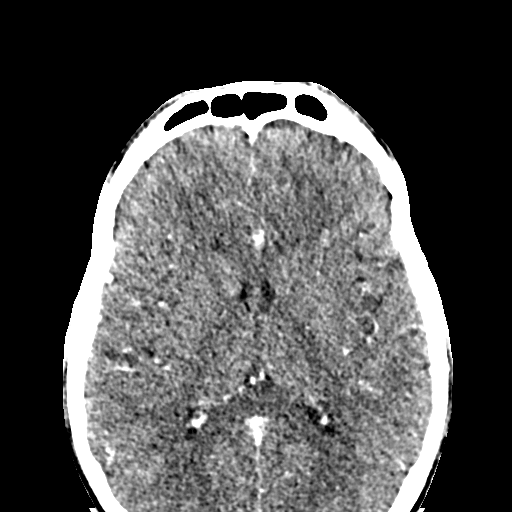
[im 84/91  bone]
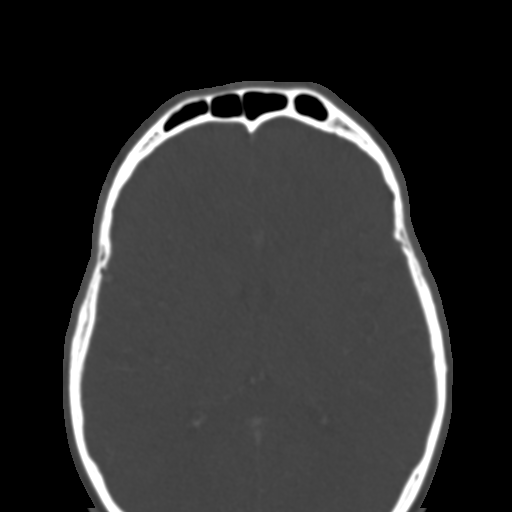

[Series 4: coronal soft · coronal · 0.33mm/px · 3 of 85 slices shown]
[im 29/85  bone]
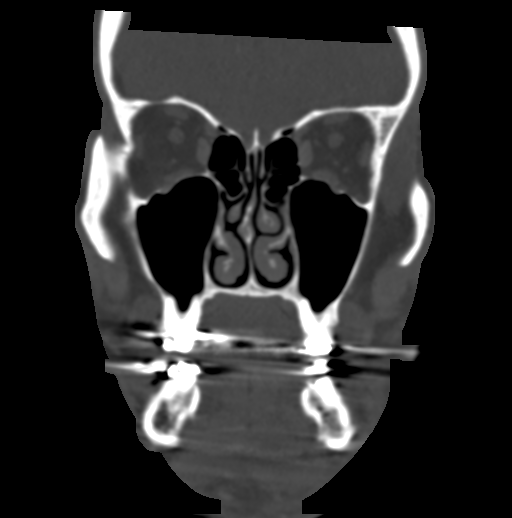
[im 38/85  bone]
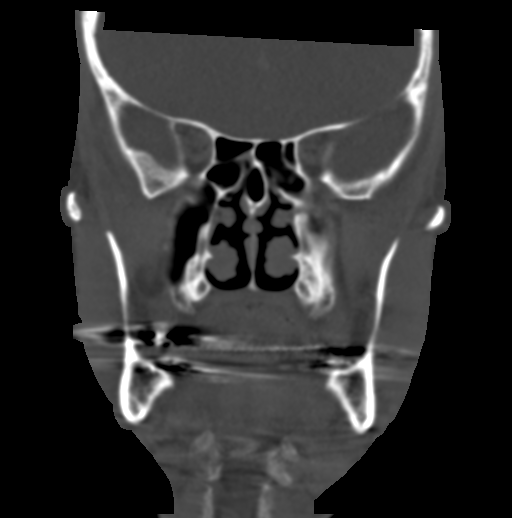
[im 47/85  bone]
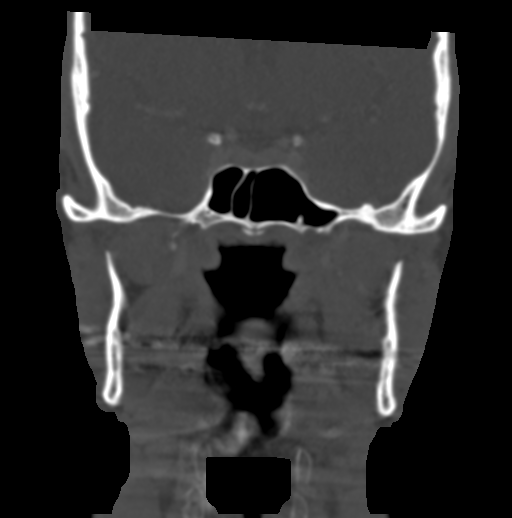

[Series 5: sagittal soft · sagittal · 0.34mm/px · 3 of 75 slices shown]
[im 25/75  bone]
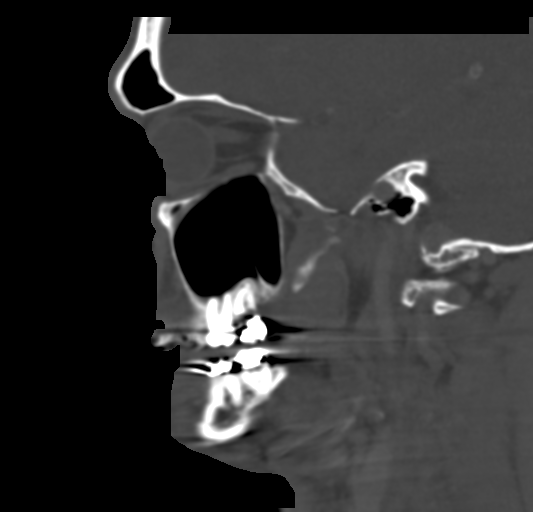
[im 38/75  bone]
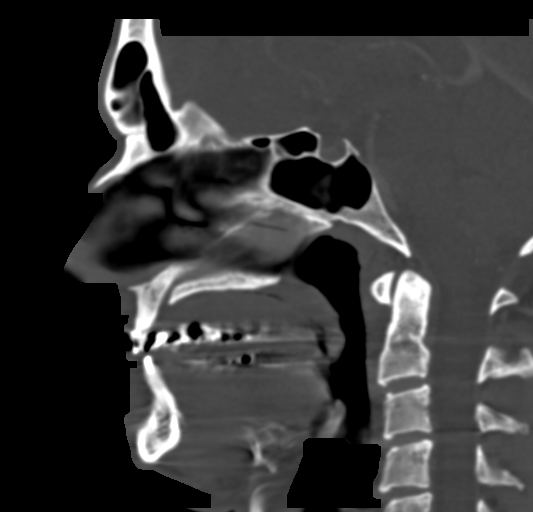
[im 50/75  bone]
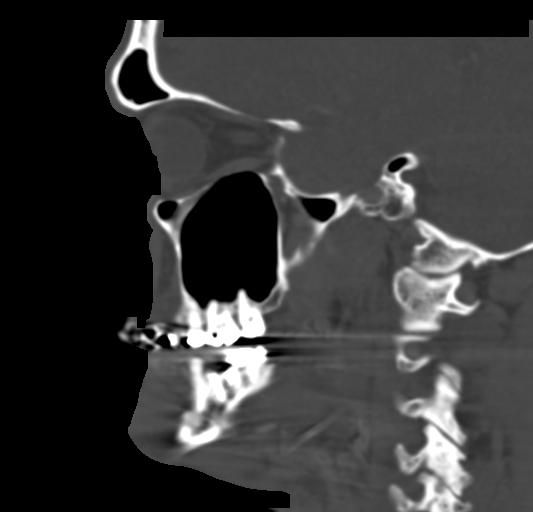

[15 of 47 positions shown; findings below may reference images not displayed]

FINDINGS: Osseous: Examination technically limited by motion artifact.

No acute osseous abnormality about the face. Periapical lucency
noted about the left maxillary lateral incisor (series 3, image 49).

Orbits: Globes and orbital soft tissues within normal limits.

Sinuses: Paranasal sinuses are clear. Mastoid air cells and middle
ear cavities are well pneumatized and free of fluid.

Soft tissues: Focal soft tissue swelling seen at the central and
left aspect of the chin, just to the left of midline (series 2,
image 68), suggesting cellulitis given provided history. Mild hazy
stranding extends into the submental region. Overlying focal skin
protuberance within this region could reflect a focal draining sinus
tract (series 2, image 68). Probable underlying ill-defined phlegmon
without definite discrete abscess or drainable fluid collection. No
other collections elsewhere about the chin. Mildly prominent
submental lymph nodes noted measuring up to 7 mm, likely reactive.

No other acute soft tissue abnormality identified about the
visualized face and neck. Oral cavity and oropharynx limited
assessment due to motion artifact and extensive streak artifact from
dental amalgam. No retropharyngeal collection.

Limited intracranial: Unremarkable.
IMPRESSION: 1. Focal soft tissue swelling at the central and left aspect of the
chin, just to the left of midline, consistent with cellulitis given
provided history. Overlying focal skin protuberance within this
region could reflect a focal draining sinus tract. No definite
discrete abscess or drainable fluid collection identified on this
motion degraded exam.
2. Periapical lucency about the left maxillary lateral incisor,
suggesting periapical abscess. No associated inflammatory changes.

She

## 2022-01-19 ENCOUNTER — Emergency Department: Payer: BC Managed Care – PPO

## 2022-01-19 ENCOUNTER — Other Ambulatory Visit: Payer: Self-pay

## 2022-01-19 ENCOUNTER — Emergency Department
Admission: EM | Admit: 2022-01-19 | Discharge: 2022-01-19 | Disposition: A | Discharge status: Home / Self Care | Payer: BC Managed Care – PPO | Attending: Emergency Medicine | Admitting: Emergency Medicine

## 2022-01-19 DIAGNOSIS — M5126 Other intervertebral disc displacement, lumbar region: Secondary | ICD-10-CM | POA: Diagnosis not present

## 2022-01-19 DIAGNOSIS — M545 Low back pain, unspecified: Secondary | ICD-10-CM | POA: Diagnosis present

## 2022-01-19 DIAGNOSIS — M5441 Lumbago with sciatica, right side: Secondary | ICD-10-CM | POA: Diagnosis not present

## 2022-01-19 MED ORDER — CYCLOBENZAPRINE HCL 10 MG PO TABS: 10.0000 mg | ORAL_TABLET | Freq: Three times a day (TID) | ORAL | 0 refills | Status: AC | PRN

## 2022-01-19 MED ORDER — KETOROLAC TROMETHAMINE 30 MG/ML IJ SOLN
30.0000 mg | Freq: Once | INTRAMUSCULAR | Status: AC
  Administered 2022-01-19: 30 mg via INTRAMUSCULAR
  Filled 2022-01-19: qty 1

## 2022-01-19 MED ORDER — KETOROLAC TROMETHAMINE 30 MG/ML IJ SOLN: 30.0000 mg | Freq: Once | INTRAMUSCULAR | Status: DC

## 2022-01-19 MED ORDER — NAPROXEN 500 MG PO TABS: 500.0000 mg | ORAL_TABLET | Freq: Two times a day (BID) | ORAL | 11 refills | Status: AC

## 2022-01-19 NOTE — ED Notes (Signed)
dc ppw provided to patient. qusetions, followup and rx info addressed. PT provides verbal consent for dc at this time. pt ambulatory to lobby alert and oriented x4. 

## 2022-01-19 NOTE — ED Triage Notes (Signed)
Pt arrives with c/o lower back pain that started after he bent over to put his shoes on yesterday. Per pt, OTC meds are on working for the pain. Pt endorses pain that radiates down his right leg. Pt denies urinary/stool incontinence.

## 2022-01-19 NOTE — Discharge Instructions (Addendum)
-  Take the cyclobenzaprine and naproxen as prescribed.  Use caution when taking the cyclobenzaprine, as it may make you drowsy.  -Follow-up with the orthopedist as discussed  -Return to the emergency department anytime if you begin to experience any new or worsening symptoms.

## 2022-01-19 NOTE — ED Notes (Signed)
Pt c/o of back pain that started yesterday. Pt states he has a herniated disk. Pt denies any incontinence of urine nor stool.

## 2022-01-19 NOTE — ED Provider Notes (Signed)
[facility] Provider Note    Event Date/Time   First MD Initiated Contact with Patient 01/19/22 2017     (approximate)   History   Chief Complaint Back Pain   HPI Bryan Coffey is a 41 y.o. male, no remarkable medical history, presents to the emergency department for evaluation of back pain.  Patient states that he bent over to put his shoes on yesterday when he felt a sudden onset of midline back pain in the lumbar region.  He states that he has had disc herniations and sciatic pain in the right lower extremity in the past, though states that his much worse today.  Denies fever/chills, chest pain, abdominal pain, shortness of breath, rash/lesions, numbness/tingling in upper or lower extremities, bowel/bladder dysfunction, or dizziness/lightheadedness.  He states that he is still able to ambulate on his own.   History Limitations: No limitations.        Physical Exam  Triage Vital Signs: ED Triage Vitals  Enc Vitals Group     BP 01/19/22 1953 (!) 136/95     Pulse Rate 01/19/22 1953 81     Resp 01/19/22 1953 20     Temp 01/19/22 1953 98.7 F (37.1 C)     Temp Source 01/19/22 1953 Oral     SpO2 01/19/22 1953 97 %     Weight 01/19/22 1954 185 lb (83.9 kg)     Height 01/19/22 1954 6\' 1"  (1.854 m)     Head Circumference --      Peak Flow --      Pain Score 01/19/22 1954 9     Pain Loc --      Pain Edu? --      Excl. in [place]? --     Most recent vital signs: Vitals:   01/19/22 1953  BP: (!) 136/95  Pulse: 81  Resp: 20  Temp: 98.7 F (37.1 C)  SpO2: 97%    General: Awake, NAD.  Skin: Warm, dry. No rashes or lesions.  Eyes: PERRL. Conjunctivae normal.  CV: Good peripheral perfusion.  Resp: Normal effort.  Abd: Soft, non-tender. No distention.  Neuro: At baseline. No gross neurological deficits.   Focused Exam: Endorses some tenderness when palpating the midline lumbar spine. Paraspinal muscle tightness appreciated bilaterally as well.   Severe pain elicited with straight leg test in both the right and left lower extremities.  Physical Exam    ED Results / Procedures / Treatments  Labs (all labs ordered are listed, but only abnormal results are displayed) Labs Reviewed - No data to display   EKG N/A.   RADIOLOGY  ED Provider Interpretation: I personally reviewed and interpreted this CT scan, disc bulging appreciated along L4/L5.  No acute fracture noted.  CT Lumbar Spine Wo Contrast  Result Date: 01/19/2022 CLINICAL DATA:  Low back pain with right-sided radiculopathy EXAM: CT LUMBAR SPINE WITHOUT CONTRAST TECHNIQUE: Multidetector CT imaging of the lumbar spine was performed without intravenous contrast administration. Multiplanar CT image reconstructions were also generated. RADIATION DOSE REDUCTION: This exam was performed according to the departmental dose-optimization program which includes automated exposure control, adjustment of the mA and/or kV according to patient size and/or use of iterative reconstruction technique. COMPARISON:  None Available. FINDINGS: Segmentation: 5 lumbar type vertebrae. Alignment: 3 mm retrolisthesis L5 on S1. Vertebrae: No acute fracture or focal pathologic process. Paraspinal and other soft tissues: Negative. Disc levels: T12-L1: No significant disc protrusion, foraminal stenosis, or canal stenosis. L1-L2: No significant disc protrusion,  foraminal stenosis, or canal stenosis. L2-L3: Focal right paracentral disc protrusion (series 4, image 65) with possible mild canal and right subarticular recess stenosis. No evidence of foraminal stenosis. L3-L4: Minimal annular disc bulge without evidence of foraminal or canal stenosis. L4-L5: Moderate-sized diffuse disc bulge with mild bilateral facet hypertrophy and ligamentum flavum buckling (series 4, image 105). Suspect at least moderate canal stenosis with bilateral subarticular recess stenosis. No evidence of high-grade foraminal stenosis. L5-S1:  Retrolisthesis with mild diffuse disc bulge. No evidence of high-grade foraminal or canal stenosis. IMPRESSION: 1. No acute fracture or traumatic malalignment of the lumbar spine. 2. Moderate-sized diffuse disc bulge at L4-L5 with possible at least moderate canal stenosis with bilateral subarticular recess stenosis. 3. Focal right paracentral disc protrusion at L2-L3 with possible mild canal and right subarticular recess stenosis. Electronically Signed   By: Davina Poke D.O.   On: 01/19/2022 21:00    PROCEDURES:  Critical Care performed: N/A.  Procedures    MEDICATIONS ORDERED IN ED: Medications  ketorolac (TORADOL) 30 MG/ML injection 30 mg (30 mg Intramuscular Given 01/19/22 2041)     IMPRESSION / MDM / ASSESSMENT AND PLAN / ED COURSE  I reviewed the triage vital signs and the nursing notes.                              Differential diagnosis includes, but is not limited to, lumbar spine fracture, lumbosacral strain, disc herniation, sciatica  ED Course Patient appears well, vitals within normal limits.  Currently endorsing mild-moderate pain at this time.  We will go ahead treat with IM ketorolac.  Assessment/Plan Patient presents with low back pain x1 day.  CT scan shows no fracture or traumatic misalignment, but does show several areas of disc bulging between L3, L4, and L5 with possible moderate canal stenosis.  Physical exam highly suggestive of sciatic pain.  Low suspicion for any serious or life-threatening pathology given his history.  He currently already has a orthopedist that he sees regularly.  We will plan to discharge him with cyclobenzaprine and naproxen and have him follow-up with his PCP/orthopedist.  We will plan to discharge  Provided the patient with anticipatory guidance, return precautions, and educational material. Encouraged the patient to return to the emergency department at any time if they begin to experience any new or worsening symptoms. Patient  expressed understanding and agreed with the plan.       FINAL CLINICAL IMPRESSION(S) / ED DIAGNOSES   Final diagnoses:  Acute right-sided low back pain with right-sided sciatica     Rx / DC Orders   ED Discharge Orders          Ordered    cyclobenzaprine (FLEXERIL) 10 MG tablet  3 times daily PRN        01/19/22 2154    naproxen (NAPROSYN) 500 MG tablet  2 times daily with meals        01/19/22 2154             Note:  This document was prepared using Dragon voice recognition software and may include unintentional dictation errors.   Teodoro Spray, [place] 01/19/22 2202    Rada Hay, MD 01/19/22 2225

## 2022-03-13 ENCOUNTER — Emergency Department
Admission: EM | Admit: 2022-03-13 | Discharge: 2022-03-13 | Discharge status: Against Medical Advice | Payer: BC Managed Care – PPO | Attending: Emergency Medicine | Admitting: Emergency Medicine

## 2022-03-13 ENCOUNTER — Other Ambulatory Visit: Payer: Self-pay

## 2022-03-13 DIAGNOSIS — Z5321 Procedure and treatment not carried out due to patient leaving prior to being seen by health care provider: Secondary | ICD-10-CM | POA: Insufficient documentation

## 2022-03-13 DIAGNOSIS — E119 Type 2 diabetes mellitus without complications: Secondary | ICD-10-CM | POA: Diagnosis not present

## 2022-03-13 DIAGNOSIS — R600 Localized edema: Secondary | ICD-10-CM | POA: Diagnosis not present

## 2022-03-13 NOTE — ED Triage Notes (Signed)
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 Pt denies history of diabetes. Slight redness noted to joint of great toe, no weeping, drainage noted.

## 2023-02-01 ENCOUNTER — Emergency Department: Payer: Self-pay

## 2023-02-01 ENCOUNTER — Other Ambulatory Visit: Payer: Self-pay

## 2023-02-01 ENCOUNTER — Emergency Department
Admission: EM | Admit: 2023-02-01 | Discharge: 2023-02-02 | Disposition: A | Discharge status: Home / Self Care | Payer: Self-pay | Attending: Emergency Medicine | Admitting: Emergency Medicine

## 2023-02-01 ENCOUNTER — Encounter: Payer: Self-pay | Admitting: Emergency Medicine

## 2023-02-01 DIAGNOSIS — R7401 Elevation of levels of liver transaminase levels: Secondary | ICD-10-CM | POA: Insufficient documentation

## 2023-02-01 DIAGNOSIS — R079 Chest pain, unspecified: Secondary | ICD-10-CM | POA: Insufficient documentation

## 2023-02-01 DIAGNOSIS — R1310 Dysphagia, unspecified: Secondary | ICD-10-CM | POA: Insufficient documentation

## 2023-02-01 DIAGNOSIS — R112 Nausea with vomiting, unspecified: Secondary | ICD-10-CM | POA: Insufficient documentation

## 2023-02-01 DIAGNOSIS — D72829 Elevated white blood cell count, unspecified: Secondary | ICD-10-CM | POA: Insufficient documentation

## 2023-02-01 LAB — CBC WITH DIFFERENTIAL/PLATELET
Abs Immature Granulocytes: 0.07 10*3/uL (ref 0.00–0.07)
Basophils Absolute: 0 10*3/uL (ref 0.0–0.1)
Basophils Relative: 0 %
Eosinophils Absolute: 0 10*3/uL (ref 0.0–0.5)
Eosinophils Relative: 0 %
HCT: 45.7 % (ref 39.0–52.0)
Hemoglobin: 15.1 g/dL (ref 13.0–17.0)
Immature Granulocytes: 0 %
Lymphocytes Relative: 14 %
Lymphs Abs: 2.2 10*3/uL (ref 0.7–4.0)
MCH: 29.2 pg (ref 26.0–34.0)
MCHC: 33 g/dL (ref 30.0–36.0)
MCV: 88.2 fL (ref 80.0–100.0)
Monocytes Absolute: 1.4 10*3/uL — ABNORMAL HIGH (ref 0.1–1.0)
Monocytes Relative: 9 %
Neutro Abs: 12 10*3/uL — ABNORMAL HIGH (ref 1.7–7.7)
Neutrophils Relative %: 77 %
Platelets: 312 10*3/uL (ref 150–400)
RBC: 5.18 MIL/uL (ref 4.22–5.81)
RDW: 13.3 % (ref 11.5–15.5)
WBC: 15.7 10*3/uL — ABNORMAL HIGH (ref 4.0–10.5)
nRBC: 0 % (ref 0.0–0.2)

## 2023-02-01 LAB — COMPREHENSIVE METABOLIC PANEL
ALT: 46 U/L — ABNORMAL HIGH (ref 0–44)
AST: 64 U/L — ABNORMAL HIGH (ref 15–41)
Albumin: 4 g/dL (ref 3.5–5.0)
Alkaline Phosphatase: 100 U/L (ref 38–126)
Anion gap: 14 (ref 5–15)
BUN: 24 mg/dL — ABNORMAL HIGH (ref 6–20)
CO2: 25 mmol/L (ref 22–32)
Calcium: 9.4 mg/dL (ref 8.9–10.3)
Chloride: 102 mmol/L (ref 98–111)
Creatinine, Ser: 1.26 mg/dL — ABNORMAL HIGH (ref 0.61–1.24)
GFR, Estimated: >60 mL/min (ref >60)
Glucose, Bld: 151 mg/dL — ABNORMAL HIGH (ref 70–99)
Potassium: 4.2 mmol/L (ref 3.5–5.1)
Sodium: 141 mmol/L (ref 135–145)
Total Bilirubin: 0.7 mg/dL (ref 0.3–1.2)
Total Protein: 8.1 g/dL (ref 6.5–8.1)

## 2023-02-01 LAB — LIPASE, BLOOD: Lipase: 23 U/L (ref 11–51)

## 2023-02-01 LAB — TROPONIN I (HIGH SENSITIVITY): Troponin I (High Sensitivity): 8 ng/L (ref <18)

## 2023-02-01 MED ORDER — LACTATED RINGERS IV BOLUS: 1000.0000 mL | Freq: Once | INTRAVENOUS | Status: DC

## 2023-02-01 MED ORDER — ONDANSETRON HCL 4 MG/2ML IJ SOLN: 4.0000 mg | Freq: Once | INTRAMUSCULAR | Status: DC

## 2023-02-01 MED ORDER — ONDANSETRON 4 MG PO TBDP
4.0000 mg | ORAL_TABLET | Freq: Once | ORAL | Status: AC
  Administered 2023-02-01: 4 mg via ORAL
  Filled 2023-02-01: qty 1

## 2023-02-01 MED ORDER — FAMOTIDINE IN NACL 20-0.9 MG/50ML-% IV SOLN: 20.0000 mg | Freq: Once | INTRAVENOUS | Status: DC

## 2023-02-01 MED ORDER — FAMOTIDINE 20 MG PO TABS
20.0000 mg | ORAL_TABLET | Freq: Once | ORAL | Status: AC
  Administered 2023-02-01: 20 mg via ORAL
  Filled 2023-02-01: qty 1

## 2023-02-01 MED ORDER — ALUM & MAG HYDROXIDE-SIMETH 200-200-20 MG/5ML PO SUSP
30.0000 mL | Freq: Once | ORAL | Status: AC
  Administered 2023-02-01: 30 mL via ORAL
  Filled 2023-02-01: qty 30

## 2023-02-01 NOTE — ED Notes (Signed)
CP has been intermittent the past couple of weeks. Started last night R side of chest. Pt having difficulty eating anything and nausea and had multiple episodes of vomiting between last night and today.

## 2023-02-01 NOTE — ED Provider Notes (Signed)
[facility] Provider Note    Event Date/Time   First MD Initiated Contact with Patient 02/01/23 2257     (approximate)   History   Chest Pain   HPI  Arkin Hagee is a 42 y.o. male history presents with right-sided chest pain.  Symptoms started 6 weeks ago.  Patient describes a burning sensation in the right chest that comes on after eating.  Describes a burning sensation that comes on with eating and drinking. Does not radiate.  It is nonpleuritic and nonexertional.  Denies dyspnea.  Has had several episodes of emesis over the last several weeks.  Tells me it all started after he thinks he had something like the flu and had nausea and vomiting.  He has been taking Tums which seemed to help with the pain.  Does not take any other antacids.  Does not use any NSAIDs regularly.  No drug or alcohol use.  Does smoke tobacco.  Denies cough or fever.     History reviewed. No pertinent past medical history.  There are no problems to display for this patient.    Physical Exam  Triage Vital Signs: ED Triage Vitals  Enc Vitals Group     BP 02/01/23 2207 (!) 128/97     Pulse Rate 02/01/23 2207 92     Resp 02/01/23 2207 20     Temp 02/01/23 2207 98.3 F (36.8 C)     Temp Source 02/01/23 2207 Oral     SpO2 02/01/23 2207 96 %     Weight 02/01/23 2200 250 lb (113.4 kg)     Height 02/01/23 2200 6\' 2"  (1.88 m)     Head Circumference --      Peak Flow --      Pain Score 02/01/23 2200 10     Pain Loc --      Pain Edu? --      Excl. in GC? --     Most recent vital signs: Vitals:   02/02/23 0000 02/02/23 0030  BP: 114/81 117/83  Pulse: 71 74  Resp: 14 18  Temp:    SpO2: 97% 95%     General: Awake, no distress.  CV:  Good peripheral perfusion.  Resp:  Normal effort.  Abd:  No distention.  No significant abdominal tenderness Neuro:             Awake, Alert, Oriented x 3  Other:     ED Results / Procedures / Treatments  Labs (all labs ordered are  listed, but only abnormal results are displayed) Labs Reviewed  CBC WITH DIFFERENTIAL/PLATELET - Abnormal; Notable for the following components:      Result Value   WBC 15.7 (*)    Neutro Abs 12.0 (*)    Monocytes Absolute 1.4 (*)    All other components within normal limits  COMPREHENSIVE METABOLIC PANEL - Abnormal; Notable for the following components:   Glucose, Bld 151 (*)    BUN 24 (*)    Creatinine, Ser 1.26 (*)    AST 64 (*)    ALT 46 (*)    All other components within normal limits  LIPASE, BLOOD  TROPONIN I (HIGH SENSITIVITY)  TROPONIN I (HIGH SENSITIVITY)     EKG  EKG interpretation performed by myself: NSR, nml axis, nml intervals, no acute ischemic changes    RADIOLOGY   I reviewed and interpreted patient's chest x-ray which shows possible right middle lobe infiltrate  PROCEDURES:  Critical Care performed: No  Procedures  The patient is on the cardiac monitor to evaluate for evidence of arrhythmia and/or significant heart rate changes.   MEDICATIONS ORDERED IN ED: Medications  alum & mag hydroxide-simeth (MAALOX/MYLANTA) 200-200-20 MG/5ML suspension 30 mL (30 mLs Oral Given 02/01/23 2346)  ondansetron (ZOFRAN-ODT) disintegrating tablet 4 mg (4 mg Oral Given 02/01/23 2345)  famotidine (PEPCID) tablet 20 mg (20 mg Oral Given 02/01/23 2345)  lactated ringers bolus 1,000 mL (0 mLs Intravenous Stopped 02/02/23 0112)  droperidol (INAPSINE) 2.5 MG/ML injection 2.5 mg (2.5 mg Intravenous Given 02/02/23 0030)     IMPRESSION / MDM / ASSESSMENT AND PLAN / ED COURSE  I reviewed the triage vital signs and the nursing notes.                              Patient's presentation is most consistent with acute complicated illness / injury requiring diagnostic workup.  Differential diagnosis includes, but is not limited to, gastritis, ulcer,, GERD, esophagitis, esophageal spasm, patient ACS, PE or dissection   The patient is a 42 year old male who presents with several  weeks of right-sided chest pain.  It comes on after eating and described as a burning feeling.  He has no associated dyspnea diaphoresis has had some nausea and vomiting.  Pain is worse at night and after eating solids.  Has been sticking to liquids mostly.  Has been taking Tums which does provide significant relief.  Denies melena or diarrhea history  On exam patient is extremely uncomfortable.  His vital signs are reassuring.  He is saturating 100%.  He has no significant abdominal tenderness specifically no tenderness in the right upper quadrant. Labs are notable for leukocytosis AKI as well creatinine 1.26.  Mild transaminitis.  Lipase is normal.  Troponin is negative EKG is nonischemic.  Patient's chest x-ray is read as possible right middle lobe infiltrate versus fat pad deformity clinically.  Clinically my suspicion for pneumonia as well given patient is not short of breath febrile or coughing.  Did discuss results with him and recommended repeat x-ray in several weeks.  Symptoms are worse after eating, burning and worse at night I do suspect this is likely GI related we will give GI cocktail Pepcid and a bolus of fluid.  Will obtain right upper quadrant ultrasound.  Right upper quadrant ultrasound shows sludge versus artifact no stones no evidence of cholecystitis.  On repeat assessment after GI cocktail and Pepcid patient is feeling improved.  He is describing predominant odynophagia and question whether he has esophageal spasm versus of esophagitis.  Will start him on Protonix and refer to GI for endoscopy.        FINAL CLINICAL IMPRESSION(S) / ED DIAGNOSES   Final diagnoses:  Chest pain, unspecified type  Odynophagia     Rx / DC Orders   ED Discharge Orders          Ordered    pantoprazole (PROTONIX) 40 MG tablet  Daily        02/02/23 0102             Note:  This document was prepared using Dragon voice recognition software and may include unintentional dictation  errors.   Georga Hacking, MD 02/02/23 [phone]

## 2023-02-01 NOTE — ED Notes (Signed)
Ultrasound at bedside

## 2023-02-01 NOTE — ED Notes (Signed)
EDT called lab to come perform a straight stick on pt due to pt being a difficult stick. Spoke to "Mykia" and she informed EDT someone will be sent from lab shortly.

## 2023-02-01 NOTE — ED Triage Notes (Signed)
Patient ambulatory to triage with steady gait, without difficulty or distress noted; pt reports for last several wks having intermittent rt lower CP, nonradiating accomp by nausea; denies hx of same

## 2023-02-01 NOTE — ED Notes (Signed)
Patient transported to X-ray 

## 2023-02-01 NOTE — ED Notes (Signed)
Patient hard stick states he always has to get poked multiple times. This RN attempted but put in for a IV consult due to difficult stick. Notified MD changed some medications from IV to PO.

## 2023-02-02 LAB — TROPONIN I (HIGH SENSITIVITY): Troponin I (High Sensitivity): 8 ng/L (ref <18)

## 2023-02-02 MED ORDER — DROPERIDOL 2.5 MG/ML IJ SOLN
2.5000 mg | Freq: Once | INTRAMUSCULAR | Status: AC
  Administered 2023-02-02: 2.5 mg via INTRAVENOUS
  Filled 2023-02-02: qty 2

## 2023-02-02 MED ORDER — LACTATED RINGERS IV BOLUS
1000.0000 mL | Freq: Once | INTRAVENOUS | Status: AC
  Administered 2023-02-02: 1000 mL via INTRAVENOUS

## 2023-02-02 MED ORDER — PANTOPRAZOLE SODIUM 40 MG PO TBEC: 40.0000 mg | DELAYED_RELEASE_TABLET | Freq: Every day | ORAL | 0 refills | Status: AC

## 2023-02-02 NOTE — Discharge Instructions (Addendum)
I suspect that you either have inflammation of your stomach or esophagus which is causing your pain.  Please start taking the Protonix daily.  Please avoid foods that are making you feel worse.  Please avoid any NSAIDs or alcohol.  Please follow-up with gastroenterology.

## 2023-04-27 ENCOUNTER — Ambulatory Visit: Payer: Self-pay | Admitting: Physician Assistant

## 2023-04-27 NOTE — Progress Notes (Deleted)
Bryan Amy, PA-C [address]  [address]  [place], [place] [address]  Main: [phone]  Fax: [phone]   Gastroenterology Consultation  Referring Provider:     [facility] Primary Care Physician:  [facility] Primary Gastroenterologist:  *** Reason for Consultation:     ER follow-up chest pain and esophageal spasm        HPI:   Bryan Coffey is a 42 y.o. y/o male referred for consultation & management  by [facility].    Patient went to [facility] ED 01/2023 for chest pain.  Was seen at [facility] ED 02/2023 for esophageal spasm.  He has been having a burning sensation in his mid chest worse after eating and drinking.  Nonexertional and nonpleuritic.  No dyspnea.  Has had episodes of vomiting.  Took Tums with mild benefit.  Was started on Pepcid.  RUQ ultrasound 02/01/2023 showed mild gallbladder sludge.  No gallstones.  Normal common bile duct 6.1 mm.  Evidence of hepatic steatosis with no liver lesions.  CBC showed mild elevated white count 15.7.  Normal hemoglobin 15.1.  Elevated AST 64, ALT 46.  Dehydration with BUN 24, creatinine 1.26, glucose 151, GFR greater than 60.  Normal bilirubin and alk phos.  Negative troponins.  Repeat labs 03/08/2023 showed elevated LFTs with AST 64, ALT 61.  Improved renal function with BUN 8, creatinine 0.8, GFR greater than 90.  Normal CBC with white count 8.3, hemoglobin 13.7.  Alcohol?  No past medical history on file.  No past surgical history on file.  Prior to Admission medications   Medication Sig Start Date End Date Taking? Authorizing Provider  HYDROcodone-acetaminophen (NORCO/VICODIN) 5-325 MG tablet Take 1 tablet by mouth every 4 (four) hours as needed for moderate pain. 12/22/18   Cuthriell, Delorise Royals, PA-C  lidocaine (XYLOCAINE) 2 % solution Use as directed 15 mLs in the mouth or throat as needed for mouth pain. 09/04/20   Lucy Chris, PA  mupirocin ointment (BACTROBAN) 2 % Apply to affected area 3  times daily 12/17/18   Emily Filbert, MD  pantoprazole (PROTONIX) 40 MG tablet Take 1 tablet (40 mg total) by mouth daily. 02/02/23 03/04/23  Georga Hacking, MD    No family history on file.   Social History   Tobacco Use   Smoking status: Every Day   Smokeless tobacco: Never  Vaping Use   Vaping status: Never Used  Substance Use Topics   Alcohol use: Not Currently   Drug use: Not Currently    Allergies as of 04/27/2023   (No Known Allergies)    Review of Systems:    All systems reviewed and negative except where noted in HPI.   Physical Exam:  There were no vitals taken for this visit. No LMP for male patient.  General:   Alert,  Well-developed, well-nourished, pleasant and cooperative in NAD Lungs:  Respirations even and unlabored.  Clear throughout to auscultation.   No wheezes, crackles, or rhonchi. No acute distress. Heart:  Regular rate and rhythm; no murmurs, clicks, rubs, or gallops. Abdomen:  Normal bowel sounds.  No bruits.  Soft, and non-distended without masses, hepatosplenomegaly or hernias noted.  No Tenderness.  No guarding or rebound tenderness.    Neurologic:  Alert and oriented x3;  grossly normal neurologically. Psych:  Alert and cooperative. Normal mood and affect.  Imaging Studies: No results found.  Assessment and Plan:   Bryan Coffey is a 42 y.o. y/o male has  been referred for   1.  Chest pain most consistent with acid reflux  Start PPI  EGD if no better on PPI  Recommend Lifestyle Modifications to prevent Acid Reflux.  Rec. Avoid coffee, sodas, peppermint, citrus fruits, and spicey foods.  Avoid eating 2-3 hours before bedtime.   2.  Elevated LFTs  Stop alcohol  Repeat LFTs, viral hepatitis labs  3.  Gallbladder sludge  Low-fat diet  Consider surgical referral if he develops RUQ pain   Follow up ***  Bryan Amy, PA-C    BP check ***
